# Patient Record
Sex: Male | Born: 1940 | Race: White | Hispanic: No | Smoking: Never smoker
Health system: Southern US, Community
[De-identification: ages and names within clinical notes are randomized; demographics above are authoritative.]

## PROBLEM LIST (undated history)

## (undated) DIAGNOSIS — R0600 Dyspnea, unspecified: Secondary | ICD-10-CM

## (undated) DIAGNOSIS — J189 Pneumonia, unspecified organism: Secondary | ICD-10-CM

## (undated) DIAGNOSIS — Z8719 Personal history of other diseases of the digestive system: Secondary | ICD-10-CM

## (undated) DIAGNOSIS — I10 Essential (primary) hypertension: Secondary | ICD-10-CM

## (undated) DIAGNOSIS — R4702 Dysphasia: Secondary | ICD-10-CM

## (undated) DIAGNOSIS — K219 Gastro-esophageal reflux disease without esophagitis: Secondary | ICD-10-CM

## (undated) DIAGNOSIS — I639 Cerebral infarction, unspecified: Secondary | ICD-10-CM

## (undated) HISTORY — PX: COLONOSCOPY: SHX174

## (undated) HISTORY — PX: VASECTOMY: SHX75

---

## 2021-06-01 DIAGNOSIS — N486 Induration penis plastica: Secondary | ICD-10-CM | POA: Diagnosis not present

## 2021-06-01 DIAGNOSIS — K219 Gastro-esophageal reflux disease without esophagitis: Secondary | ICD-10-CM | POA: Diagnosis not present

## 2021-06-01 DIAGNOSIS — I10 Essential (primary) hypertension: Secondary | ICD-10-CM | POA: Diagnosis not present

## 2021-06-01 DIAGNOSIS — Z8673 Personal history of transient ischemic attack (TIA), and cerebral infarction without residual deficits: Secondary | ICD-10-CM | POA: Diagnosis not present

## 2021-06-01 DIAGNOSIS — E663 Overweight: Secondary | ICD-10-CM | POA: Diagnosis not present

## 2021-06-01 DIAGNOSIS — E785 Hyperlipidemia, unspecified: Secondary | ICD-10-CM | POA: Diagnosis not present

## 2021-06-01 DIAGNOSIS — Z125 Encounter for screening for malignant neoplasm of prostate: Secondary | ICD-10-CM | POA: Diagnosis not present

## 2021-07-25 DIAGNOSIS — N529 Male erectile dysfunction, unspecified: Secondary | ICD-10-CM | POA: Diagnosis not present

## 2021-07-25 DIAGNOSIS — N486 Induration penis plastica: Secondary | ICD-10-CM | POA: Diagnosis not present

## 2021-09-02 ENCOUNTER — Other Ambulatory Visit: Payer: Self-pay | Admitting: Internal Medicine

## 2021-09-02 ENCOUNTER — Ambulatory Visit
Admission: RE | Admit: 2021-09-02 | Discharge: 2021-09-02 | Disposition: A | Discharge status: Home / Self Care | Payer: PPO | Source: Ambulatory Visit | Attending: Internal Medicine | Admitting: Internal Medicine

## 2021-09-02 DIAGNOSIS — R051 Acute cough: Secondary | ICD-10-CM | POA: Diagnosis not present

## 2021-09-02 DIAGNOSIS — R5383 Other fatigue: Secondary | ICD-10-CM | POA: Diagnosis not present

## 2021-09-02 DIAGNOSIS — R0602 Shortness of breath: Secondary | ICD-10-CM | POA: Diagnosis not present

## 2021-09-02 DIAGNOSIS — Z23 Encounter for immunization: Secondary | ICD-10-CM | POA: Diagnosis not present

## 2021-09-02 DIAGNOSIS — R1319 Other dysphagia: Secondary | ICD-10-CM | POA: Diagnosis not present

## 2021-09-02 DIAGNOSIS — R630 Anorexia: Secondary | ICD-10-CM | POA: Diagnosis not present

## 2021-09-02 DIAGNOSIS — R634 Abnormal weight loss: Secondary | ICD-10-CM | POA: Diagnosis not present

## 2021-09-02 DIAGNOSIS — Z0000 Encounter for general adult medical examination without abnormal findings: Secondary | ICD-10-CM | POA: Diagnosis not present

## 2021-09-16 ENCOUNTER — Other Ambulatory Visit: Payer: Self-pay | Admitting: Internal Medicine

## 2021-09-16 DIAGNOSIS — R1314 Dysphagia, pharyngoesophageal phase: Secondary | ICD-10-CM | POA: Diagnosis not present

## 2021-09-16 DIAGNOSIS — R1319 Other dysphagia: Secondary | ICD-10-CM

## 2021-09-16 DIAGNOSIS — J189 Pneumonia, unspecified organism: Secondary | ICD-10-CM | POA: Diagnosis not present

## 2021-09-16 DIAGNOSIS — K219 Gastro-esophageal reflux disease without esophagitis: Secondary | ICD-10-CM | POA: Diagnosis not present

## 2021-09-23 ENCOUNTER — Ambulatory Visit
Admission: RE | Admit: 2021-09-23 | Discharge: 2021-09-23 | Disposition: A | Discharge status: Home / Self Care | Payer: PPO | Source: Ambulatory Visit | Attending: Internal Medicine | Admitting: Internal Medicine

## 2021-09-23 DIAGNOSIS — R1319 Other dysphagia: Secondary | ICD-10-CM

## 2021-09-23 DIAGNOSIS — R1310 Dysphagia, unspecified: Secondary | ICD-10-CM | POA: Diagnosis not present

## 2021-10-14 DIAGNOSIS — Z86010 Personal history of colonic polyps: Secondary | ICD-10-CM | POA: Diagnosis not present

## 2021-10-14 DIAGNOSIS — K219 Gastro-esophageal reflux disease without esophagitis: Secondary | ICD-10-CM | POA: Diagnosis not present

## 2021-10-14 DIAGNOSIS — T17308A Unspecified foreign body in larynx causing other injury, initial encounter: Secondary | ICD-10-CM | POA: Diagnosis not present

## 2021-10-17 DIAGNOSIS — N529 Male erectile dysfunction, unspecified: Secondary | ICD-10-CM | POA: Diagnosis not present

## 2021-10-17 DIAGNOSIS — N486 Induration penis plastica: Secondary | ICD-10-CM | POA: Diagnosis not present

## 2021-10-18 ENCOUNTER — Other Ambulatory Visit: Payer: Self-pay | Admitting: Urology

## 2021-10-19 ENCOUNTER — Other Ambulatory Visit (HOSPITAL_COMMUNITY): Payer: Self-pay

## 2021-10-19 DIAGNOSIS — R1310 Dysphagia, unspecified: Secondary | ICD-10-CM

## 2021-11-02 ENCOUNTER — Ambulatory Visit (HOSPITAL_COMMUNITY)
Admission: RE | Admit: 2021-11-02 | Discharge: 2021-11-02 | Disposition: A | Discharge status: Home / Self Care | Payer: PPO | Source: Ambulatory Visit | Attending: Gastroenterology | Admitting: Gastroenterology

## 2021-11-02 DIAGNOSIS — R1310 Dysphagia, unspecified: Secondary | ICD-10-CM | POA: Diagnosis not present

## 2021-11-02 DIAGNOSIS — K219 Gastro-esophageal reflux disease without esophagitis: Secondary | ICD-10-CM | POA: Insufficient documentation

## 2021-11-02 DIAGNOSIS — T17308A Unspecified foreign body in larynx causing other injury, initial encounter: Secondary | ICD-10-CM | POA: Insufficient documentation

## 2021-11-02 DIAGNOSIS — Z713 Dietary counseling and surveillance: Secondary | ICD-10-CM | POA: Diagnosis not present

## 2021-11-02 DIAGNOSIS — I10 Essential (primary) hypertension: Secondary | ICD-10-CM | POA: Insufficient documentation

## 2021-11-02 DIAGNOSIS — Z8673 Personal history of transient ischemic attack (TIA), and cerebral infarction without residual deficits: Secondary | ICD-10-CM | POA: Insufficient documentation

## 2021-11-02 DIAGNOSIS — E785 Hyperlipidemia, unspecified: Secondary | ICD-10-CM | POA: Insufficient documentation

## 2021-11-02 DIAGNOSIS — X58XXXA Exposure to other specified factors, initial encounter: Secondary | ICD-10-CM | POA: Insufficient documentation

## 2021-11-10 ENCOUNTER — Encounter (HOSPITAL_BASED_OUTPATIENT_CLINIC_OR_DEPARTMENT_OTHER): Payer: Self-pay | Admitting: Urology

## 2021-11-11 ENCOUNTER — Encounter (HOSPITAL_BASED_OUTPATIENT_CLINIC_OR_DEPARTMENT_OTHER): Payer: Self-pay | Admitting: Urology

## 2021-11-11 ENCOUNTER — Other Ambulatory Visit: Payer: Self-pay

## 2021-11-11 NOTE — Progress Notes (Signed)
Spoke w/ via phone for pre-op interview---pt Lab needs dos----I stat and EKG           Lab results------n/a COVID test -----patient states asymptomatic no test needed Arrive at -------1000 NPO after MN NO Solid Food.  Clear liquids from MN until---1000 Med rec completed Medications to take morning of surgery -----amlodipine and pantoprazole Diabetic medication -----n/a Patient instructed no nail polish to be worn day of surgery Patient instructed to bring photo id and insurance card day of surgery Patient aware to have Driver (ride ) / caregiver  friend Joseph Crosby  for 24 hours after surgery  Patient Special Instructions -----overnight instructions explained to pt verbalized understanding. Pre-Op special Istructions -----hold aspirin per dr. Lafonda Mosses office stated.  Pt told to come in 3 hrs before surgery case.  Patient verbalized understanding of instructions that were given at this phone interview. Patient denies shortness of breath, chest pain, fever, cough at this phone interview.   Pt had a Baruim swallow and on chart in epic. 11/02/2021.  Pt able to swallow pills and food without problems.

## 2021-11-22 ENCOUNTER — Encounter (HOSPITAL_BASED_OUTPATIENT_CLINIC_OR_DEPARTMENT_OTHER): Payer: Self-pay | Admitting: Urology

## 2021-11-22 ENCOUNTER — Other Ambulatory Visit: Payer: Self-pay

## 2021-11-22 ENCOUNTER — Ambulatory Visit (HOSPITAL_BASED_OUTPATIENT_CLINIC_OR_DEPARTMENT_OTHER): Payer: PPO | Admitting: Certified Registered Nurse Anesthetist

## 2021-11-22 ENCOUNTER — Encounter (HOSPITAL_BASED_OUTPATIENT_CLINIC_OR_DEPARTMENT_OTHER)
Admission: RE | Disposition: A | Discharge status: Home / Self Care | Payer: Self-pay | Source: Ambulatory Visit | Attending: Urology

## 2021-11-22 ENCOUNTER — Observation Stay (HOSPITAL_BASED_OUTPATIENT_CLINIC_OR_DEPARTMENT_OTHER)
Admission: RE | Admit: 2021-11-22 | Discharge: 2021-11-23 | Disposition: A | Discharge status: Home / Self Care | Payer: PPO | Source: Ambulatory Visit | Attending: Urology | Admitting: Urology

## 2021-11-22 DIAGNOSIS — Z79899 Other long term (current) drug therapy: Secondary | ICD-10-CM | POA: Insufficient documentation

## 2021-11-22 DIAGNOSIS — Z8673 Personal history of transient ischemic attack (TIA), and cerebral infarction without residual deficits: Secondary | ICD-10-CM | POA: Insufficient documentation

## 2021-11-22 DIAGNOSIS — Z960 Presence of urogenital implants: Secondary | ICD-10-CM

## 2021-11-22 DIAGNOSIS — N528 Other male erectile dysfunction: Secondary | ICD-10-CM | POA: Diagnosis not present

## 2021-11-22 DIAGNOSIS — N486 Induration penis plastica: Secondary | ICD-10-CM | POA: Insufficient documentation

## 2021-11-22 DIAGNOSIS — I10 Essential (primary) hypertension: Secondary | ICD-10-CM | POA: Insufficient documentation

## 2021-11-22 DIAGNOSIS — Z7982 Long term (current) use of aspirin: Secondary | ICD-10-CM | POA: Diagnosis not present

## 2021-11-22 DIAGNOSIS — N529 Male erectile dysfunction, unspecified: Secondary | ICD-10-CM

## 2021-11-22 HISTORY — PX: NESBIT PROCEDURE: SHX2087

## 2021-11-22 HISTORY — DX: Essential (primary) hypertension: I10

## 2021-11-22 HISTORY — DX: Dysphasia: R47.02

## 2021-11-22 HISTORY — DX: Personal history of other diseases of the digestive system: Z87.19

## 2021-11-22 HISTORY — DX: Gastro-esophageal reflux disease without esophagitis: K21.9

## 2021-11-22 HISTORY — DX: Pneumonia, unspecified organism: J18.9

## 2021-11-22 HISTORY — PX: PENILE PROSTHESIS IMPLANT: SHX240

## 2021-11-22 HISTORY — DX: Dyspnea, unspecified: R06.00

## 2021-11-22 HISTORY — DX: Cerebral infarction, unspecified: I63.9

## 2021-11-22 LAB — POCT I-STAT, CHEM 8
BUN: 12 mg/dL (ref 8–23)
Calcium, Ion: 1.19 mmol/L (ref 1.15–1.40)
Chloride: 102 mmol/L (ref 98–111)
Creatinine, Ser: 0.8 mg/dL (ref 0.61–1.24)
Glucose, Bld: 114 mg/dL — ABNORMAL HIGH (ref 70–99)
HCT: 44 % (ref 39.0–52.0)
Hemoglobin: 15 g/dL (ref 13.0–17.0)
Potassium: 3.8 mmol/L (ref 3.5–5.1)
Sodium: 139 mmol/L (ref 135–145)
TCO2: 24 mmol/L (ref 22–32)

## 2021-11-22 SURGERY — INSERTION, PENILE PROSTHESIS
Anesthesia: General | Site: Penis

## 2021-11-22 MED ORDER — GLYCOPYRROLATE PF 0.2 MG/ML IJ SOSY
PREFILLED_SYRINGE | INTRAMUSCULAR | Status: AC
  Filled 2021-11-22: qty 1

## 2021-11-22 MED ORDER — OXYCODONE HCL 5 MG PO TABS
ORAL_TABLET | ORAL | Status: AC
  Filled 2021-11-22: qty 1

## 2021-11-22 MED ORDER — PROPOFOL 10 MG/ML IV BOLUS
INTRAVENOUS | Status: AC
  Filled 2021-11-22: qty 20

## 2021-11-22 MED ORDER — FENTANYL CITRATE (PF) 100 MCG/2ML IJ SOLN: 25.0000 ug | INTRAMUSCULAR | Status: DC | PRN

## 2021-11-22 MED ORDER — SODIUM CHLORIDE 0.9 % IV SOLN: INTRAVENOUS | Status: DC

## 2021-11-22 MED ORDER — VANCOMYCIN HCL IN DEXTROSE 1-5 GM/200ML-% IV SOLN
INTRAVENOUS | Status: AC
  Filled 2021-11-22: qty 200

## 2021-11-22 MED ORDER — SODIUM CHLORIDE 0.9 % IR SOLN
Status: DC | PRN
  Administered 2021-11-22: 1000 mL

## 2021-11-22 MED ORDER — LIDOCAINE 2% (20 MG/ML) 5 ML SYRINGE
INTRAMUSCULAR | Status: DC | PRN
  Administered 2021-11-22: 60 mg via INTRAVENOUS

## 2021-11-22 MED ORDER — DEXAMETHASONE SODIUM PHOSPHATE 10 MG/ML IJ SOLN
INTRAMUSCULAR | Status: DC | PRN
  Administered 2021-11-22: 10 mg via INTRAVENOUS

## 2021-11-22 MED ORDER — IRRISEPT - 450ML BOTTLE WITH 0.05% CHG IN STERILE WATER, USP 99.95% OPTIME
TOPICAL | Status: DC | PRN
  Administered 2021-11-22: 450 mL

## 2021-11-22 MED ORDER — VANCOMYCIN HCL 1500 MG/300ML IV SOLN
1500.0000 mg | INTRAVENOUS | Status: AC
  Administered 2021-11-22: 1500 mg via INTRAVENOUS
  Filled 2021-11-22: qty 300

## 2021-11-22 MED ORDER — PHENYLEPHRINE HCL (PRESSORS) 10 MG/ML IV SOLN
INTRAVENOUS | Status: AC
  Filled 2021-11-22: qty 1

## 2021-11-22 MED ORDER — LIDOCAINE HCL (PF) 1 % IJ SOLN
INTRAMUSCULAR | Status: DC | PRN
  Administered 2021-11-22: 10 mL

## 2021-11-22 MED ORDER — LACTATED RINGERS IV SOLN
INTRAVENOUS | Status: DC
  Administered 2021-11-22: 1000 mL via INTRAVENOUS

## 2021-11-22 MED ORDER — ACETAMINOPHEN 325 MG PO TABS
ORAL_TABLET | ORAL | Status: AC
  Filled 2021-11-22: qty 2

## 2021-11-22 MED ORDER — KETOROLAC TROMETHAMINE 30 MG/ML IJ SOLN
INTRAMUSCULAR | Status: AC
  Filled 2021-11-22: qty 3

## 2021-11-22 MED ORDER — OXYCODONE HCL 5 MG/5ML PO SOLN: 5.0000 mg | Freq: Once | ORAL | Status: DC | PRN

## 2021-11-22 MED ORDER — ONDANSETRON HCL 4 MG/2ML IJ SOLN: 4.0000 mg | Freq: Once | INTRAMUSCULAR | Status: DC | PRN

## 2021-11-22 MED ORDER — FENTANYL CITRATE (PF) 100 MCG/2ML IJ SOLN
INTRAMUSCULAR | Status: DC | PRN
  Administered 2021-11-22 (×4): 25 ug via INTRAVENOUS

## 2021-11-22 MED ORDER — ACETAMINOPHEN 500 MG PO TABS
1000.0000 mg | ORAL_TABLET | Freq: Once | ORAL | Status: AC
Start: 2021-11-22 — End: 2021-11-22
  Administered 2021-11-22: 1000 mg via ORAL

## 2021-11-22 MED ORDER — VANCOMYCIN HCL IN DEXTROSE 1-5 GM/200ML-% IV SOLN
1000.0000 mg | Freq: Three times a day (TID) | INTRAVENOUS | Status: AC
  Administered 2021-11-22: 1000 mg via INTRAVENOUS

## 2021-11-22 MED ORDER — CHLORHEXIDINE GLUCONATE 4 % EX LIQD
Freq: Once | CUTANEOUS | Status: DC
Start: 2021-11-22 — End: 2021-11-22

## 2021-11-22 MED ORDER — ROCURONIUM BROMIDE 10 MG/ML (PF) SYRINGE
PREFILLED_SYRINGE | INTRAVENOUS | Status: DC | PRN
  Administered 2021-11-22 (×2): 30 mg via INTRAVENOUS

## 2021-11-22 MED ORDER — ACETAMINOPHEN 325 MG PO TABS
650.0000 mg | ORAL_TABLET | Freq: Four times a day (QID) | ORAL | Status: DC
Start: 2021-11-22 — End: 2021-11-25
  Administered 2021-11-22 – 2021-11-23 (×3): 650 mg via ORAL

## 2021-11-22 MED ORDER — PHENYLEPHRINE HCL-NACL 20-0.9 MG/250ML-% IV SOLN
INTRAVENOUS | Status: DC | PRN
  Administered 2021-11-22: 20 ug/min via INTRAVENOUS

## 2021-11-22 MED ORDER — ONDANSETRON HCL 4 MG/2ML IJ SOLN
INTRAMUSCULAR | Status: AC
  Filled 2021-11-22: qty 2

## 2021-11-22 MED ORDER — OXYCODONE HCL 5 MG PO TABS: 5.0000 mg | ORAL_TABLET | Freq: Once | ORAL | Status: DC | PRN

## 2021-11-22 MED ORDER — CELECOXIB 200 MG PO CAPS
200.0000 mg | ORAL_CAPSULE | Freq: Two times a day (BID) | ORAL | Status: DC
Start: 2021-11-22 — End: 2021-11-25
  Administered 2021-11-22: 200 mg via ORAL

## 2021-11-22 MED ORDER — ACETAMINOPHEN 500 MG PO TABS
ORAL_TABLET | ORAL | Status: AC
  Filled 2021-11-22: qty 2

## 2021-11-22 MED ORDER — GENTAMICIN SULFATE 40 MG/ML IJ SOLN
150.0000 mg | INTRAVENOUS | Status: AC
  Administered 2021-11-22: 150 mg via INTRAVENOUS
  Filled 2021-11-22 (×2): qty 3.75

## 2021-11-22 MED ORDER — PROPOFOL 10 MG/ML IV BOLUS
INTRAVENOUS | Status: DC | PRN
  Administered 2021-11-22: 120 mg via INTRAVENOUS

## 2021-11-22 MED ORDER — ROCURONIUM BROMIDE 10 MG/ML (PF) SYRINGE
PREFILLED_SYRINGE | INTRAVENOUS | Status: AC
  Filled 2021-11-22: qty 10

## 2021-11-22 MED ORDER — CELECOXIB 200 MG PO CAPS
ORAL_CAPSULE | ORAL | Status: AC
  Filled 2021-11-22: qty 1

## 2021-11-22 MED ORDER — SUCCINYLCHOLINE CHLORIDE 200 MG/10ML IV SOSY
PREFILLED_SYRINGE | INTRAVENOUS | Status: DC | PRN
  Administered 2021-11-22: 120 mg via INTRAVENOUS

## 2021-11-22 MED ORDER — ONDANSETRON HCL 4 MG/2ML IJ SOLN
INTRAMUSCULAR | Status: DC | PRN
  Administered 2021-11-22: 4 mg via INTRAVENOUS

## 2021-11-22 MED ORDER — SULFAMETHOXAZOLE-TRIMETHOPRIM 800-160 MG PO TABS
1.0000 | ORAL_TABLET | Freq: Two times a day (BID) | ORAL | Status: DC
  Administered 2021-11-22: 1 via ORAL
  Filled 2021-11-22: qty 1

## 2021-11-22 MED ORDER — SUGAMMADEX SODIUM 200 MG/2ML IV SOLN
INTRAVENOUS | Status: DC | PRN
  Administered 2021-11-22: 100 mg via INTRAVENOUS

## 2021-11-22 MED ORDER — PHENYLEPHRINE 80 MCG/ML (10ML) SYRINGE FOR IV PUSH (FOR BLOOD PRESSURE SUPPORT)
PREFILLED_SYRINGE | INTRAVENOUS | Status: AC
  Filled 2021-11-22: qty 20

## 2021-11-22 MED ORDER — GLYCOPYRROLATE PF 0.2 MG/ML IJ SOSY
PREFILLED_SYRINGE | INTRAMUSCULAR | Status: DC | PRN
  Administered 2021-11-22: .2 mg via INTRAVENOUS

## 2021-11-22 MED ORDER — PHENYLEPHRINE 80 MCG/ML (10ML) SYRINGE FOR IV PUSH (FOR BLOOD PRESSURE SUPPORT)
PREFILLED_SYRINGE | INTRAVENOUS | Status: DC | PRN
  Administered 2021-11-22: 80 ug via INTRAVENOUS

## 2021-11-22 MED ORDER — FENTANYL CITRATE (PF) 100 MCG/2ML IJ SOLN
INTRAMUSCULAR | Status: AC
  Filled 2021-11-22: qty 2

## 2021-11-22 MED ORDER — DEXAMETHASONE SODIUM PHOSPHATE 10 MG/ML IJ SOLN
INTRAMUSCULAR | Status: AC
  Filled 2021-11-22: qty 1

## 2021-11-22 MED ORDER — OXYCODONE HCL 5 MG PO TABS
5.0000 mg | ORAL_TABLET | Freq: Four times a day (QID) | ORAL | Status: DC | PRN
  Administered 2021-11-22 – 2021-11-23 (×3): 5 mg via ORAL

## 2021-11-22 SURGICAL SUPPLY — 57 items
ADH SKN CLS APL DERMABOND .7 (GAUZE/BANDAGES/DRESSINGS) ×1
APL PRP STRL LF DISP 70% ISPRP (MISCELLANEOUS) ×1
BAG DRN RND TRDRP ANRFLXCHMBR (UROLOGICAL SUPPLIES) ×1
BAG URINE DRAIN 2000ML AR STRL (UROLOGICAL SUPPLIES) ×1 IMPLANT
BIOPATCH BLUE 3/4IN DISK W/1.5 (GAUZE/BANDAGES/DRESSINGS) ×1 IMPLANT
BLADE SURG 15 STRL LF DISP TIS (BLADE) ×1 IMPLANT
BLADE SURG 15 STRL SS (BLADE) ×1
BNDG GAUZE DERMACEA FLUFF (GAUZE/BANDAGES/DRESSINGS) ×1
BNDG GAUZE DERMACEA FLUFF 4 (GAUZE/BANDAGES/DRESSINGS) ×1 IMPLANT
BNDG GZE DERMACEA 4 6PLY (GAUZE/BANDAGES/DRESSINGS) ×1
CATH COUDE 5CC RIBBED (CATHETERS) ×1 IMPLANT
CATH RIBBED COUDE 5CC (CATHETERS) ×1
CHLORAPREP W/TINT 26 (MISCELLANEOUS) ×1 IMPLANT
COVER BACK TABLE 60X90IN (DRAPES) ×1 IMPLANT
COVER MAYO STAND STRL (DRAPES) ×2 IMPLANT
DERMABOND ADVANCED (GAUZE/BANDAGES/DRESSINGS) ×1
DERMABOND ADVANCED .7 DNX12 (GAUZE/BANDAGES/DRESSINGS) ×1 IMPLANT
DRAIN CHANNEL 10F 3/8 F FF (DRAIN) ×1 IMPLANT
DRAPE INCISE IOBAN 66X45 STRL (DRAPES) ×1 IMPLANT
DRAPE LAPAROTOMY 100X72 PEDS (DRAPES) ×1 IMPLANT
EVACUATOR DRAINAGE 7X20 100CC (MISCELLANEOUS) IMPLANT
EVACUATOR SILICONE 100CC (MISCELLANEOUS)
GAUZE 4X4 16PLY ~~LOC~~+RFID DBL (SPONGE) ×1 IMPLANT
GAUZE SPONGE 4X4 12PLY STRL (GAUZE/BANDAGES/DRESSINGS) ×1 IMPLANT
GLOVE BIO SURGEON STRL SZ7 (GLOVE) ×1 IMPLANT
GLOVE BIO SURGEON STRL SZ7.5 (GLOVE) ×3 IMPLANT
GOWN STRL REUS W/TWL LRG LVL3 (GOWN DISPOSABLE) ×1 IMPLANT
HOLDER FOLEY CATH W/STRAP (MISCELLANEOUS) ×1 IMPLANT
JET LAVAGE IRRISEPT WOUND (IRRIGATION / IRRIGATOR) ×2
KIT ACCESSORY AMS 700 PUMP (UROLOGICAL SUPPLIES) IMPLANT
KIT TURNOVER CYSTO (KITS) ×1 IMPLANT
LAVAGE JET IRRISEPT WOUND (IRRIGATION / IRRIGATOR) IMPLANT
NEEDLE HYPO 22GX1.5 SAFETY (NEEDLE) IMPLANT
NS IRRIG 1000ML POUR BTL (IV SOLUTION) ×1 IMPLANT
PACK BASIN DAY SURGERY FS (CUSTOM PROCEDURE TRAY) ×1 IMPLANT
PANTS MESH DISP LRG (UNDERPADS AND DIAPERS) ×1 IMPLANT
PENCIL SMOKE EVACUATOR (MISCELLANEOUS) ×1 IMPLANT
PLUG CATH AND CAP STER (CATHETERS) ×1 IMPLANT
PUMP PENILE AMS 700CX MS 12X21 (Erectile Restoration) IMPLANT
RESERVOIR FLAT IZ 100ML (Miscellaneous) IMPLANT
RETRACTOR DEEP SCROTAL PENILE (MISCELLANEOUS) IMPLANT
RETRACTOR WILSON SYSTEM (INSTRUMENTS) IMPLANT
SET COLLECT BLD 21X.75 12 PB G (NEEDLE) IMPLANT
SET COLLECT BLD 25X3/4 12 (NEEDLE) IMPLANT
SUT MON AB 3-0 SH 27 (SUTURE) ×4
SUT MON AB 3-0 SH27 (SUTURE) ×4 IMPLANT
SUT VIC AB 2-0 UR6 27 (SUTURE) ×2 IMPLANT
SUT VIC AB 4-0 PS2 18 (SUTURE) IMPLANT
SYR 10ML LL (SYRINGE) ×3 IMPLANT
SYR 50ML LL SCALE MARK (SYRINGE) ×2 IMPLANT
SYR BULB IRRIG 60ML STRL (SYRINGE) ×1 IMPLANT
SYR CONTROL 10ML LL (SYRINGE) IMPLANT
TOWEL OR 17X26 10 PK STRL BLUE (TOWEL DISPOSABLE) ×2 IMPLANT
TRAY DSU PREP LF (CUSTOM PROCEDURE TRAY) ×1 IMPLANT
TUBE CONNECTING 12X1/4 (SUCTIONS) ×1 IMPLANT
WATER STERILE IRR 1000ML POUR (IV SOLUTION) ×1 IMPLANT
YANKAUER SUCT BULB TIP NO VENT (SUCTIONS) ×2 IMPLANT

## 2021-11-22 NOTE — Op Note (Signed)
PATIENT:  Joseph Crosby  PRE-OPERATIVE DIAGNOSIS:   Organic erectile dysfunction Peyronie's Disease  POST-OPERATIVE DIAGNOSIS:  Same  PROCEDURE:  Procedure(s):  3 piece inflatable penile prosthesis (BS/AMS) Penile Plication  SURGEON:  [name] MD  INDICATION: He has had long-standing organic erectile dysfunction and refractory to other modes of treatment. Mr Anselmi also has clinically significant Peyronie's Disease. He has elected to proceed with prosthesis implantation.  ANESTHESIA:  General  EBL:  Minimal  Device: 3 piece AMS CX 700: 100 cc reservoir, 21 cm cylinders and 0 cm rear-tip extenders on right and left sides  LOCAL MEDICATIONS USED:  None  SPECIMEN: None  DISPOSITION OF SPECIMEN:  N/A  Description of procedure: The patient was taken to the major operating room, placed on the table and administered general anesthesia in the supine position. His genitalia was then prepped with chlorhexidine x 2. He was draped in the usual sterile fashion, and I used [place] on the field. An official timeout was then performed.  The patient was given an artificial erection with a butterfly needle, NS, and 10 cc of lidocaine/marcaine. There was ~55 degrees of dorsal curvature, and <20 degrees leftward curvature. A dorsal penile block was done with the lidocaine/marcaine mixture as well.  A 14 French coude catheter was then placed in the bladder and the bladder was drained and the catheter was plugged. A midline high penoscrotal incision was then made and the dissection was carried down to the corpora and urethra. The dissection was extended distally, past the point of maximum curvature on the penis. A plicating suture was done on each corpora using a 2-0 Ethibond. Another artificial erection was given, and the curvature had improved to around 40-45 degrees.   The lonestar retractor was positioned so as to have excellent exposure. 2-0 Vicryl sutures were then placed proximally in each corpus  cavernosum to serve as stay sutures. An incision was then made in the corpus cavernosum first on the left-hand side with the bovie. Jen Mow were used to gently dilate the opening. I then dilated the corpus cavernosum with the a 12 Fr brooks dilator distally and proximally. Field goal post tests were performed and there was no evidence of perforations or crossover. I then irrigated the corpus cavernosum with antibiotic solution and measured the distance proximally and distally from the stay suture and was found to be 10.5 and 10 cm, respectively.I then turned my attention to the contralateral corpus cavernosum and placed my stay sutures, made my corporotomy and dilated the corpus cavernosum in an identical fashion. This was measured and also was found to be 10 cm proximally and 11 distally. It was irrigated with anastomotic solution as was the scrotum. I then chose an 21 cm cylinder set with 0 cm rear-tip extenders and these were prepped while I prepared the site for reservoir placement.  I then digitally probed into the R external inguinal ring. A kelly was used to poke through the posterior wall of the ring. I used my finger to ensure I was in the appropriate space, and to clear room for the reservoir. I irrigated the space with anastomotic solution and then placed the reservoir in this location. I then filled the reservoir with 100 cc of sterile saline, and checked to confirm proper position. There was minimal backpressure with the reservoir max-filled.  Attention was redirected to the corporotomies where the cylinders were then placed by first fixing the suture to the distal aspect of the right cylinder to a straight needle.  This was then loaded on the [facility] inserter and passed through the corporotomy and distally. I then advanced the straight needle with the Furlow inserter out through the glans and this was grasped with a hemostat and pulled through the glans and the suture was secured with a hemostat. I then  performed an identical maneuver on the contralateral side. After this was performed I irrigated both corpus cavernosum; there was no evidence of urethral perforation. I inserted the distal portion of the cylinder through the corporotomies and pulled this to the end of the corpora with the suture. The proximal aspect with the rear-tip extender was then passed through the corporotomy and into the seated position on each side. I then connected reservoir tubing to a syringe filled with sterile saline and inflated the device. I noted a good erection with both cylinders equidistant under the glans and no buckling of the cylinders. At this point the curvature was <30 degrees, so we elected to not perform modeling.   I therefore deflated the device and closed the corporotomies with used my previously placed stay sutures. The cylinder was then connected to the pump after excising the excess tubing with appropriate shodded hemostats in place and then I used the supplied connectors to make the connection. I then again cycled the device with the pump and it cycled properly. I deflated the device and pumped it up about half way to aid with hemostasis. I irrigated the scrotum once again with antibiotic solution.  I then grasped the scrotal skin in the midline with a babcock, and used a hemostat to dissect down to the dependent-most portion of the scrotum. The nasal speculum was inserted into this space, and facilitated placement of the pump. I irrigated the wound one last time with antibiotic irrigation and then closed the deep scrotal tissue over the tubing and pump with running 3-0 monocryl suture. I placed a 19 Fr blake drain over the corporotomies. A second layer was then closed over this first layer with running 3- 0 monocryl, and running skin suture w/ 4-0 monocryl performed. Incision dressed with dermabond.  A mummy wrap was applied. The catheter was connected to closed system drainage, and drain connected to suction  bulb and the patient was awakened and taken recovery room in stable and satisfactory condition. He tolerated the procedure well and there were no intraoperative complications. Needle sponge and instrument counts were correct at the end of the operation.

## 2021-11-22 NOTE — Anesthesia Procedure Notes (Signed)
Procedure Name: Intubation Date/Time: 11/22/2021 1:04 PM  Performed by: Rogers Blocker, CRNAPre-anesthesia Checklist: Patient identified, Emergency Drugs available, Suction available and Patient being monitored Patient Re-evaluated:Patient Re-evaluated prior to induction Oxygen Delivery Method: Circle System Utilized Preoxygenation: Pre-oxygenation with 100% oxygen Induction Type: IV induction Ventilation: Mask ventilation without difficulty Laryngoscope Size: Mac and 4 Grade View: Grade I Tube type: Oral Tube size: 7.5 mm Number of attempts: 1 Airway Equipment and Method: Stylet and Bite block Placement Confirmation: ETT inserted through vocal cords under direct vision, positive ETCO2 and breath sounds checked- equal and bilateral Secured at: 22 cm Tube secured with: Tape Dental Injury: Teeth and Oropharynx as per pre-operative assessment

## 2021-11-22 NOTE — Anesthesia Preprocedure Evaluation (Addendum)
Anesthesia Evaluation  Patient identified by MRN, date of birth, ID band Patient awake    Reviewed: Allergy & Precautions, NPO status , Patient's Chart, lab work & pertinent test results  Airway Mallampati: III  TM Distance: >3 FB Neck ROM: Full    Dental  (+) Teeth Intact, Dental Advisory Given   Pulmonary pneumonia (09/2021), resolved,    Pulmonary exam normal breath sounds clear to auscultation       Cardiovascular hypertension (139/72 in preop), Pt. on medications Normal cardiovascular exam Rhythm:Regular Rate:Normal     Neuro/Psych TIAnegative psych ROS   GI/Hepatic Neg liver ROS, hiatal hernia, GERD  Medicated and Controlled,Has been experiencing some early morning dysphagia/coughing up phlegm, but able to swallow pills/eat solids without issue. normal barium swallow study 11/2021  Denies reflex currently    Endo/Other  negative endocrine ROS  Renal/GU negative Renal ROS  negative genitourinary   Musculoskeletal negative musculoskeletal ROS (+)   Abdominal   Peds  Hematology negative hematology ROS (+)   Anesthesia Other Findings   Reproductive/Obstetrics ED, peyronies                             Anesthesia Physical Anesthesia Plan  ASA: 2  Anesthesia Plan: General   Post-op Pain Management: Tylenol PO (pre-op)*   Induction: Intravenous and Rapid sequence  PONV Risk Score and Plan: 2 and Ondansetron, Dexamethasone, Midazolam and Treatment may vary due to age or medical condition  Airway Management Planned: Oral ETT  Additional Equipment: None  Intra-op Plan:   Post-operative Plan: Extubation in OR  Informed Consent: I have reviewed the patients History and Physical, chart, labs and discussed the procedure including the risks, benefits and alternatives for the proposed anesthesia with the patient or authorized representative who has indicated his/her understanding and  acceptance.     Dental advisory given  Plan Discussed with: CRNA  Anesthesia Plan Comments:        Anesthesia Quick Evaluation

## 2021-11-22 NOTE — Transfer of Care (Signed)
Immediate Anesthesia Transfer of Care Note  Patient: Joseph Crosby  Procedure(s) Performed: IMPLANTATION OF INFLATABLE PENILE PROSTHESIS (Penis) NESBIT PROCEDURE/PENILE PLICATION/POSSIBLE INCISION AND GRAFTING (Penis)  Patient Location: PACU  Anesthesia Type:General  Level of Consciousness: awake, alert , oriented and patient cooperative  Airway & Oxygen Therapy: Patient Spontanous Breathing with face mask O2  Post-op Assessment: Report given to RN and Post -op Vital signs reviewed and stable  Post vital signs: Reviewed and stable  Last Vitals:  Vitals Value Taken Time  BP 145/83 11/22/21 1526  Temp    Pulse 67 11/22/21 1528  Resp 15 11/22/21 1528  SpO2 97 % 11/22/21 1528  Vitals shown include unvalidated device data.  Last Pain:  Vitals:   11/22/21 1010  TempSrc: Oral  PainSc: 0-No pain      Patients Stated Pain Goal: 7 (11/22/21 1010)  Complications: No notable events documented.

## 2021-11-22 NOTE — Discharge Instructions (Signed)
Penile prosthesis postoperative instructions  Wound:  In most cases your incision will have absorbable sutures that will dissolve within the first 10-20 days. Some will fall out even earlier. Expect some redness as the sutures dissolved but this should occur only around the sutures. If there is generalized redness, especially with increasing pain or swelling, let us know. The scrotum and penis will very likely get "black and blue" as the blood in the tissues spread. Sometimes the whole scrotum will turn colors. The black and blue is followed by a yellow and brown color. In time, all the discoloration will go away. In some cases some firm swelling in the area of the testicle and pump may persist for up to 4-6 weeks after the surgery and is considered normal in most cases.  Drain:   You may be discharged home with a drain in place. If so, you will be taught how to empty it and should keep track of the output. Additionally, you should call the office to arrange for an appointment to have it removed after a few days.   Diet:  You may return to your normal diet within 24 hours following your surgery. You may note some mild nausea and possibly vomiting the first 6-8 hours following surgery. This is usually due to the side effects of anesthesia, and will disappear quite soon. I would suggest clear liquids and a very light meal the first evening following your surgery.  Activity:  Your physical activity should be restricted the first 48 hours. During that time you should remain relatively inactive, moving about only when necessary. During the first 3 weeks following surgery you should avoid lifting any heavy objects (anything greater than 15 pounds), and avoid strenuous exercise. If you work, ask us specifically about your restrictions, both for work and home. We will write a note to your employer if needed.  Avoid using your penis until your follow up visit with Dr Jasmyn Picha, which will typically be around  3-4 weeks following the surgery. Most people are able to start cycling their device after that appointment, and can have intercourse soon thereafter.   You should plan to wear a tight pair of jockey shorts or an athletic supporter for the first 4-5 days, even to sleep. This will keep the scrotum immobilized to some degree and keep the swelling down.The position of your penis will determine what is most comfortable but I strongly urge you to keep the penis in the "up" position (toward your head). You should continue to tuck "up" your penis when possible for the first 3 months following surgery.  Ice packs should be placed on and off over the scrotum for the first 48 hours. Frozen peas or corn in a ZipLock bag can be frozen, used and re-frozen. Fifteen minutes on and 15 minutes off is a reasonable schedule. The ice is a good pain reliever and keeps the swelling down.  Hygiene:  You may shower 48 hours after your surgery. Tub bathing should be restricted until the wound is completely healed, typically around 2-3 weeks.  Medication:  You will be sent home with some type of pain medication. In many cases you will be sent home with a strong anti-inflammatory medication (Celebrex, Meloxicam) and a narcotic pain pill (hydrocodone or oxycodone). You can also supplement these medications with tylenol (acetaminophen). If the pain medication you are sent home with does not control the pain, please notify the office Problems you should report to us:  Fever of 101.0 degrees   Fahrenheit or greater. Moderate or severe swelling under the skin incision or involving the scrotum. Drug reaction such as hives, a rash, nausea or vomiting.  

## 2021-11-22 NOTE — H&P (Signed)
H&P   History of Present Illness: Joseph Crosby is a 81 y.o. year old with a history of refractory ED and Peyronie's disease. He presents today for implantation of penile prosthesis and indicated procedures for Peyronies.  Past Medical History:  Diagnosis Date   Dysphasia    barrium on 11/02/2021  able to swallow pills and eat with no problems   Dyspnea    when he had pneumonia in june 2023   GERD (gastroesophageal reflux disease)    History of hiatal hernia    Hypertension    Pneumonia    had pneumonia in june 2023   Stroke [facility])    called them TIA hasn't had any in 10 yrears. 11/11/2021    Past Surgical History:  Procedure Laterality Date   COLONOSCOPY N/A    has had a few. 11/11/2021   VASECTOMY     had done 20+ yrs ago. 11/11/2021    Home Medications:  Current Meds  Medication Sig   amLODipine (NORVASC) 5 MG tablet Take 5 mg by mouth daily.   aspirin EC 81 MG tablet Take 81 mg by mouth daily. Swallow whole.   atorvastatin (LIPITOR) 40 MG tablet Take 40 mg by mouth daily. In evening time   hydrochlorothiazide (MICROZIDE) 12.5 MG capsule Take 12.5 mg by mouth daily.   losartan (COZAAR) 50 MG tablet Take 50 mg by mouth daily.   Multiple Vitamin (MULTIVITAMIN) tablet Take 1 tablet by mouth daily.   oxymetazoline (AFRIN) 0.05 % nasal spray Place 1 spray into both nostrils 2 (two) times daily.   pantoprazole (PROTONIX) 40 MG tablet Take 40 mg by mouth daily.    Allergies: Not on File  History reviewed. No pertinent family history.  Social History:  reports that he has never smoked. He has never used smokeless tobacco. He reports that he does not currently use alcohol. He reports that he does not use drugs.  ROS: A complete review of systems was performed.  All systems are negative except for pertinent findings as noted.  Physical Exam:  Vital signs in last 24 hours: Temp:  [98.3 F (36.8 C)] 98.3 F (36.8 C) (08/22 1010) Pulse Rate:  [65] 65 (08/22 1010) Resp:   [16] 16 (08/22 1010) BP: (139)/(72) 139/72 (08/22 1010) SpO2:  [99 %] 99 % (08/22 1010) Weight:  [84 kg] 84 kg (08/22 1010) Constitutional:  Alert and oriented, No acute distress Cardiovascular: Regular rate and rhythm, No JVD Respiratory: Normal respiratory effort, Lungs clear bilaterally GI: Abdomen is soft, nontender, nondistended, no abdominal masses GU: No CVA tenderness Lymphatic: No lymphadenopathy Neurologic: Grossly intact, no focal deficits Psychiatric: Normal mood and affect Drains/Tubes: none   Laboratory Data:  Recent Labs    11/22/21 0957  HGB 15.0  HCT 44.0    Recent Labs    11/22/21 0957  NA 139  K 3.8  CL 102  GLUCOSE 114*  BUN 12  CREATININE 0.80     Results for orders placed or performed during the hospital encounter of 11/22/21 (from the past 24 hour(s))  I-STAT, chem 8     Status: Abnormal   Collection Time: 11/22/21  9:57 AM  Result Value Ref Range   Sodium 139 135 - 145 mmol/L   Potassium 3.8 3.5 - 5.1 mmol/L   Chloride 102 98 - 111 mmol/L   BUN 12 8 - 23 mg/dL   Creatinine, Ser 5.36 0.61 - 1.24 mg/dL   Glucose, Bld 144 (H) 70 - 99 mg/dL  Calcium, Ion 1.19 1.15 - 1.40 mmol/L   TCO2 24 22 - 32 mmol/L   Hemoglobin 15.0 13.0 - 17.0 g/dL   HCT 49.7 53.0 - 05.1 %   No results found for this or any previous visit (from the past 240 hour(s)).  Renal Function: Recent Labs    11/22/21 0957  CREATININE 0.80   Estimated Creatinine Clearance: 80.3 mL/min (by C-G formula based on SCr of 0.8 mg/dL).  Radiologic Imaging: No results found.  Assessment:  81 yo M with ED and Peyronie's disease   Plan:  To OR as planned. We reviewed risks (including but not limited to bleeding, infection, device infection, damage to associated structures requiring aborting the procedure, persistent penile curvature, device malfunction)  Joseph Seal, MD 11/22/2021, 11:17 AM  [facility] Pager: [phone]

## 2021-11-23 DIAGNOSIS — N528 Other male erectile dysfunction: Secondary | ICD-10-CM | POA: Diagnosis not present

## 2021-11-23 MED ORDER — ACETAMINOPHEN 325 MG PO TABS: 650.0000 mg | ORAL_TABLET | Freq: Four times a day (QID) | ORAL | 1 refills | Status: DC | PRN

## 2021-11-23 MED ORDER — OXYCODONE HCL 5 MG PO TABS: 5.0000 mg | ORAL_TABLET | Freq: Four times a day (QID) | ORAL | 0 refills | Status: DC | PRN

## 2021-11-23 MED ORDER — OXYCODONE HCL 5 MG PO TABS
ORAL_TABLET | ORAL | Status: AC
  Filled 2021-11-23: qty 1

## 2021-11-23 MED ORDER — ONDANSETRON HCL 4 MG/2ML IJ SOLN
4.0000 mg | INTRAMUSCULAR | Status: DC | PRN
Start: 2021-11-23 — End: 2021-11-25
  Administered 2021-11-23: 4 mg via INTRAVENOUS

## 2021-11-23 MED ORDER — ACETAMINOPHEN 325 MG PO TABS
ORAL_TABLET | ORAL | Status: AC
  Filled 2021-11-23: qty 2

## 2021-11-23 MED ORDER — SULFAMETHOXAZOLE-TRIMETHOPRIM 800-160 MG PO TABS: 1.0000 | ORAL_TABLET | Freq: Two times a day (BID) | ORAL | 0 refills | Status: AC

## 2021-11-23 MED ORDER — ONDANSETRON HCL 4 MG/2ML IJ SOLN
INTRAMUSCULAR | Status: AC
  Filled 2021-11-23: qty 2

## 2021-11-23 MED ORDER — ONDANSETRON 8 MG PO TBDP: 8.0000 mg | ORAL_TABLET | Freq: Three times a day (TID) | ORAL | 0 refills | Status: DC | PRN

## 2021-11-23 MED ORDER — CELECOXIB 200 MG PO CAPS: 200.0000 mg | ORAL_CAPSULE | Freq: Two times a day (BID) | ORAL | 0 refills | Status: DC

## 2021-11-23 NOTE — Anesthesia Postprocedure Evaluation (Signed)
Anesthesia Post Note  Patient: Joseph Crosby  Procedure(s) Performed: IMPLANTATION OF INFLATABLE PENILE PROSTHESIS (Penis) NESBIT PROCEDURE/PENILE PLICATION/POSSIBLE INCISION AND GRAFTING (Penis)     Patient location during evaluation: PACU Anesthesia Type: General Level of consciousness: awake and alert, oriented and patient cooperative Pain management: pain level controlled Vital Signs Assessment: post-procedure vital signs reviewed and stable Respiratory status: spontaneous breathing, nonlabored ventilation and respiratory function stable Cardiovascular status: blood pressure returned to baseline and stable Postop Assessment: no apparent nausea or vomiting Anesthetic complications: no   No notable events documented.  Last Vitals:  Vitals:   11/23/21 0630 11/23/21 0910  BP: 110/70 127/68  Pulse: 73 90  Resp: 14 15  Temp: 36.8 C 36.8 C  SpO2: 93% 94%    Last Pain:  Vitals:   11/23/21 0910  TempSrc:   PainSc: 0-No pain                 Lannie Fields

## 2021-11-23 NOTE — Discharge Summary (Signed)
Date of admission: 11/22/2021  Date of discharge: 11/23/2021  Admission diagnosis:  S/P insertion of penile implant [Z96.0]   Discharge diagnosis:  S/P insertion of penile implant [Z96.0]  Secondary diagnoses: peyronie's disease  Active Ambulatory Problems    Diagnosis Date Noted   No Active Ambulatory Problems   Resolved Ambulatory Problems    Diagnosis Date Noted   No Resolved Ambulatory Problems   Past Medical History:  Diagnosis Date   Dysphasia    Dyspnea    GERD (gastroesophageal reflux disease)    History of hiatal hernia    Hypertension    Pneumonia    Stroke [facility])      History and Physical: For full details, please see admission history and physical. Briefly, Joseph Crosby is a 81 y.o. year old patient who was admitted who was admitted after the aforementioned operation.   Hospital Course: POD the foley was removed. The drain was removed after output was low overnight.  EXAM: GEN: NAD, Aox3 CV: RRR RESP:CTAB GU: some bruising present, minimal swelling. Drain with scant s/s output at the time of exam Foley draining clear yellow urine  On the day of discharge, the patient was tolerating a regular diet and their pain was well controlled. They were determined to be stable for discharge home  Laboratory values:  Recent Labs    11/22/21 0957  HGB 15.0  HCT 44.0   Recent Labs    11/22/21 0957  CREATININE 0.80    Disposition: Home  Discharge medications:  Allergies as of 11/23/2021   Not on File      Medication List     TAKE these medications    acetaminophen 325 MG tablet Commonly known as: TYLENOL Take 2 tablets (650 mg total) by mouth every 6 (six) hours as needed for mild pain or moderate pain.   amLODipine 5 MG tablet Commonly known as: NORVASC Take 5 mg by mouth daily.   aspirin EC 81 MG tablet Take 81 mg by mouth daily. Swallow whole.   atorvastatin 40 MG tablet Commonly known as: LIPITOR Take 40 mg by mouth daily. In evening time    celecoxib 200 MG capsule Commonly known as: CELEBREX Take 1 capsule (200 mg total) by mouth 2 (two) times daily.   hydrochlorothiazide 12.5 MG capsule Commonly known as: MICROZIDE Take 12.5 mg by mouth daily.   losartan 50 MG tablet Commonly known as: COZAAR Take 50 mg by mouth daily.   multivitamin tablet Take 1 tablet by mouth daily.   oxyCODONE 5 MG immediate release tablet Commonly known as: Oxy IR/ROXICODONE Take 1 tablet (5 mg total) by mouth every 6 (six) hours as needed for breakthrough pain or severe pain.   oxymetazoline 0.05 % nasal spray Commonly known as: AFRIN Place 1 spray into both nostrils 2 (two) times daily.   pantoprazole 40 MG tablet Commonly known as: PROTONIX Take 40 mg by mouth daily.   sulfamethoxazole-trimethoprim 800-160 MG tablet Commonly known as: BACTRIM DS Take 1 tablet by mouth 2 (two) times daily for 10 days.               Discharge Care Instructions  (From admission, onward)           Start     Ordered   11/23/21 0000  No dressing needed        11/23/21 0835            Followup:   Follow-up Information     Despina Arias,  MD Follow up on 12/19/2021.   Specialty: Urology Contact information: [address] [place] [address] [phone]

## 2021-11-24 ENCOUNTER — Encounter (HOSPITAL_BASED_OUTPATIENT_CLINIC_OR_DEPARTMENT_OTHER): Payer: Self-pay | Admitting: Urology

## 2021-12-19 DIAGNOSIS — N529 Male erectile dysfunction, unspecified: Secondary | ICD-10-CM | POA: Diagnosis not present

## 2021-12-19 DIAGNOSIS — Z48816 Encounter for surgical aftercare following surgery on the genitourinary system: Secondary | ICD-10-CM | POA: Diagnosis not present

## 2022-03-03 DIAGNOSIS — H5203 Hypermetropia, bilateral: Secondary | ICD-10-CM | POA: Diagnosis not present

## 2022-03-03 DIAGNOSIS — H04123 Dry eye syndrome of bilateral lacrimal glands: Secondary | ICD-10-CM | POA: Diagnosis not present

## 2022-03-03 DIAGNOSIS — H2513 Age-related nuclear cataract, bilateral: Secondary | ICD-10-CM | POA: Diagnosis not present

## 2022-03-20 ENCOUNTER — Other Ambulatory Visit: Payer: Self-pay | Admitting: Internal Medicine

## 2022-03-20 DIAGNOSIS — E785 Hyperlipidemia, unspecified: Secondary | ICD-10-CM | POA: Diagnosis not present

## 2022-03-20 DIAGNOSIS — Z8673 Personal history of transient ischemic attack (TIA), and cerebral infarction without residual deficits: Secondary | ICD-10-CM | POA: Diagnosis not present

## 2022-03-20 DIAGNOSIS — R0602 Shortness of breath: Secondary | ICD-10-CM | POA: Diagnosis not present

## 2022-03-20 DIAGNOSIS — I10 Essential (primary) hypertension: Secondary | ICD-10-CM | POA: Diagnosis not present

## 2022-03-20 DIAGNOSIS — N486 Induration penis plastica: Secondary | ICD-10-CM | POA: Diagnosis not present

## 2022-03-20 DIAGNOSIS — R0902 Hypoxemia: Secondary | ICD-10-CM | POA: Diagnosis not present

## 2022-03-20 DIAGNOSIS — R1319 Other dysphagia: Secondary | ICD-10-CM | POA: Diagnosis not present

## 2022-03-20 DIAGNOSIS — R053 Chronic cough: Secondary | ICD-10-CM | POA: Diagnosis not present

## 2022-03-20 DIAGNOSIS — Z23 Encounter for immunization: Secondary | ICD-10-CM | POA: Diagnosis not present

## 2022-04-04 ENCOUNTER — Ambulatory Visit
Admission: RE | Admit: 2022-04-04 | Discharge: 2022-04-04 | Disposition: A | Discharge status: Home / Self Care | Payer: PPO | Source: Ambulatory Visit | Attending: Internal Medicine | Admitting: Internal Medicine

## 2022-04-04 DIAGNOSIS — J189 Pneumonia, unspecified organism: Secondary | ICD-10-CM | POA: Diagnosis not present

## 2022-04-04 DIAGNOSIS — R918 Other nonspecific abnormal finding of lung field: Secondary | ICD-10-CM | POA: Diagnosis not present

## 2022-04-04 DIAGNOSIS — R0602 Shortness of breath: Secondary | ICD-10-CM

## 2022-04-24 ENCOUNTER — Ambulatory Visit (INDEPENDENT_AMBULATORY_CARE_PROVIDER_SITE_OTHER): Payer: PPO | Admitting: Pulmonary Disease

## 2022-04-24 ENCOUNTER — Encounter: Payer: Self-pay | Admitting: Pulmonary Disease

## 2022-04-24 VITALS — BP 126/70 | HR 85 | Temp 98.6°F | Ht 71.0 in | Wt 193.0 lb

## 2022-04-24 DIAGNOSIS — J849 Interstitial pulmonary disease, unspecified: Secondary | ICD-10-CM | POA: Diagnosis not present

## 2022-04-24 NOTE — Patient Instructions (Signed)
Will get some labs today for better evaluation of the lungs Schedule high-res CT and PFTs Return to clinic after PFTs in 2 to 3 months.

## 2022-04-24 NOTE — Progress Notes (Signed)
Joseph Crosby    [id number]    March 04, 1941  Primary Care Physician:Henderson, Walker Shadow, MD  Referring Physician: Kathalene Frames, MD [address], [address] [place],  [place] [id number]  Chief complaint: Consult for interstitial lung disease  HPI: 82 y.o. who  has a past medical history of Dysphasia, Dyspnea, GERD (gastroesophageal reflux disease), History of hiatal hernia, Hypertension, Pneumonia, and Stroke ([place]).   Has been referred here for evaluation of dyspnea on exertion.  Symptoms started about 6 months ago.  He reports having pneumonia in May 2023 which was treated with antibiotics as an outpatient.  Since then he has dyspnea on exertion when he walks at a fast pace.  Denies any cough, sputum pressure, fevers, chills.  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  No feather pillows or comforters ILD questionnaire 04/24/2022-negative for any exposures. Smoking history: Never smoker Travel history: Previously lived in [place], [place].  No significant recent travel Relevant family history: No significant family history of lung disease  Outpatient Encounter Medications as of 04/24/2022  Medication Sig   amLODipine (NORVASC) 5 MG tablet Take 5 mg by mouth daily.   aspirin EC 81 MG tablet Take 81 mg by mouth daily. Swallow whole.   atorvastatin (LIPITOR) 40 MG tablet Take 40 mg by mouth daily. In evening time   hydrochlorothiazide (MICROZIDE) 12.5 MG capsule Take 12.5 mg by mouth daily.   losartan (COZAAR) 50 MG tablet Take 50 mg by mouth daily.   Multiple Vitamin (MULTIVITAMIN) tablet Take 1 tablet by mouth daily.   ondansetron (ZOFRAN-ODT) 8 MG disintegrating tablet Take 1 tablet (8 mg total) by mouth every 8 (eight) hours as  needed for nausea or vomiting.   oxyCODONE (OXY IR/ROXICODONE) 5 MG immediate release tablet Take 1 tablet (5 mg total) by mouth every 6 (six) hours as needed for breakthrough pain or severe pain.   oxymetazoline (AFRIN) 0.05 % nasal spray Place 1 spray into both nostrils 2 (two) times daily.   pantoprazole (PROTONIX) 40 MG tablet Take 40 mg by mouth daily.   [DISCONTINUED] acetaminophen (TYLENOL) 325 MG tablet Take 2 tablets (650 mg total) by mouth every 6 (six) hours as needed for mild pain or moderate pain.   [DISCONTINUED] celecoxib (CELEBREX) 200 MG capsule Take 1 capsule (200 mg total) by mouth 2 (two) times daily.   No facility-administered encounter medications on file as of 04/24/2022.    Allergies as of 04/24/2022   (No Known Allergies)    Past Medical History:  Diagnosis Date   Dysphasia    barrium on 11/02/2021  able to swallow pills and eat with no problems   Dyspnea    when he had pneumonia in june 2023   GERD (gastroesophageal reflux disease)    History of hiatal hernia    Hypertension    Pneumonia    had pneumonia in june 2023   Stroke [facility])    called them TIA hasn't had any in 10 yrears. 11/11/2021    Past Surgical History:  Procedure Laterality Date   COLONOSCOPY N/A    has had a few. 11/11/2021   NESBIT PROCEDURE N/A 11/22/2021   Procedure: NESBIT PROCEDURE/PENILE PLICATION/POSSIBLE INCISION AND GRAFTING;  Surgeon: Vira Agar, MD;  Location: [facility];  Service: Urology;  Laterality: N/A;   PENILE PROSTHESIS IMPLANT N/A 11/22/2021   Procedure: IMPLANTATION OF INFLATABLE PENILE PROSTHESIS;  Surgeon: Vira Agar, MD;  Location: [facility];  Service: Urology;  Laterality: N/A;  [place]     had done 20+ yrs ago. 11/11/2021    No family history on file.  Social History   Socioeconomic History   Marital status: Widowed    Spouse name: Not on file   Number of children: Not on file   Years of  education: Not on file   Highest education level: Not on file  Occupational History   Not on file  Tobacco Use   Smoking status: Never   Smokeless tobacco: Never  Vaping Use   Vaping Use: Never used  Substance and Sexual Activity   Alcohol use: Not Currently   Drug use: Never   Sexual activity: Not on file  Other Topics Concern   Not on file  Social History Narrative   Not on file   Social Determinants of Health   Financial Resource Strain: Not on file  Food Insecurity: Not on file  Transportation Needs: Not on file  Physical Activity: Not on file  Stress: Not on file  Social Connections: Not on file  Intimate Partner Violence: Not on file    Review of systems: Review of Systems  Constitutional: Negative for fever and chills.  HENT: Negative.   Eyes: Negative for blurred vision.  Respiratory: as per HPI  Cardiovascular: Negative for chest pain and palpitations.  Gastrointestinal: Negative for vomiting, diarrhea, blood per rectum. Genitourinary: Negative for dysuria, urgency, frequency and hematuria.  Musculoskeletal: Negative for myalgias, back pain and joint pain.  Skin: Negative for itching and rash.  Neurological: Negative for dizziness, tremors, focal weakness, seizures and loss of consciousness.  Endo/Heme/Allergies: Negative for environmental allergies.  Psychiatric/Behavioral: Negative for depression, suicidal ideas and hallucinations.  All other systems reviewed and are negative.  Physical Exam: Blood pressure 126/70, pulse 85, temperature 98.6 F (37 C), temperature source Oral, height 5\' 11"  (1.803 m), weight 193 lb (87.5 kg), SpO2 95 %. Gen:      No acute distress HEENT:  EOMI, sclera anicteric Neck:     No masses; no thyromegaly Lungs:    Clear to auscultation bilaterally; normal respiratory effort CV:         Regular rate and rhythm; no murmurs Abd:      + bowel sounds; soft, non-tender; no palpable masses, no distension Ext:    No edema; adequate  peripheral perfusion Skin:      Warm and dry; no rash Neuro: alert and oriented x 3 Psych: normal mood and affect  Data Reviewed: Imaging: CT scan 04/04/2022-hazy groundglass opacities and linear bandlike opacity bases bilaterally.  Suggestive of postinflammatory scarring.  I have reviewed the images personally.  PFTs:  Labs:  Assessment:  Evaluation for dyspnea, concern for interstitial lung disease Has nonspecific dyspnea on exertion.  His CT scan shows some linear opacities which is suggestive of postinflammatory scarring.  I do not think there is significant interstitial lung disease there.  Will further evaluate by high-resolution CT, get basic labs including ANA, rheumatoid factor, CCP Schedule pulmonary function test for better evaluation of the lungs.  Plan/Recommendations: Basic CTD serologies High-res CT, PFTs.  Marshell Garfinkel MD [facility] 04/24/2022, 2:08 PM  CC: Kathalene Frames, *

## 2022-04-26 LAB — ANA,IFA RA DIAG PNL W/RFLX TIT/PATN
Anti Nuclear Antibody (ANA): NEGATIVE
Cyclic Citrullin Peptide Ab: <16 UNITS
Rheumatoid fact SerPl-aCnc: 20 IU/mL — ABNORMAL HIGH (ref <14)

## 2022-05-19 ENCOUNTER — Ambulatory Visit
Admission: RE | Admit: 2022-05-19 | Discharge: 2022-05-19 | Disposition: A | Discharge status: Home / Self Care | Payer: PPO | Source: Ambulatory Visit | Attending: Pulmonary Disease | Admitting: Pulmonary Disease

## 2022-05-19 DIAGNOSIS — J849 Interstitial pulmonary disease, unspecified: Secondary | ICD-10-CM

## 2022-05-19 DIAGNOSIS — I700 Atherosclerosis of aorta: Secondary | ICD-10-CM | POA: Diagnosis not present

## 2022-05-19 DIAGNOSIS — I2510 Atherosclerotic heart disease of native coronary artery without angina pectoris: Secondary | ICD-10-CM | POA: Diagnosis not present

## 2022-05-19 DIAGNOSIS — J984 Other disorders of lung: Secondary | ICD-10-CM | POA: Diagnosis not present

## 2022-05-30 ENCOUNTER — Encounter: Payer: Self-pay | Admitting: Pulmonary Disease

## 2022-06-06 NOTE — Telephone Encounter (Signed)
His CT looks stable from before with no significant interstitial lung disease.  There is some areas of scarring that could be from prior pneumonia.  Overall the scan looks okay  His labs are also normal except for borderline elevation of factor called ANA which is likely nonspecific.  He has PFTs ordered.  Can you please schedule PFTs and follow-up visit.

## 2022-10-30 DIAGNOSIS — I700 Atherosclerosis of aorta: Secondary | ICD-10-CM | POA: Diagnosis not present

## 2022-10-30 DIAGNOSIS — L989 Disorder of the skin and subcutaneous tissue, unspecified: Secondary | ICD-10-CM | POA: Diagnosis not present

## 2022-10-30 DIAGNOSIS — N486 Induration penis plastica: Secondary | ICD-10-CM | POA: Diagnosis not present

## 2022-10-30 DIAGNOSIS — I10 Essential (primary) hypertension: Secondary | ICD-10-CM | POA: Diagnosis not present

## 2022-10-30 DIAGNOSIS — Z0000 Encounter for general adult medical examination without abnormal findings: Secondary | ICD-10-CM | POA: Diagnosis not present

## 2022-10-30 DIAGNOSIS — I358 Other nonrheumatic aortic valve disorders: Secondary | ICD-10-CM | POA: Diagnosis not present

## 2022-10-30 DIAGNOSIS — R1319 Other dysphagia: Secondary | ICD-10-CM | POA: Diagnosis not present

## 2022-10-30 DIAGNOSIS — E785 Hyperlipidemia, unspecified: Secondary | ICD-10-CM | POA: Diagnosis not present

## 2022-10-30 DIAGNOSIS — H9190 Unspecified hearing loss, unspecified ear: Secondary | ICD-10-CM | POA: Diagnosis not present

## 2022-10-30 DIAGNOSIS — Z79899 Other long term (current) drug therapy: Secondary | ICD-10-CM | POA: Diagnosis not present

## 2022-10-30 DIAGNOSIS — Z8673 Personal history of transient ischemic attack (TIA), and cerebral infarction without residual deficits: Secondary | ICD-10-CM | POA: Diagnosis not present

## 2022-11-10 DIAGNOSIS — I358 Other nonrheumatic aortic valve disorders: Secondary | ICD-10-CM | POA: Diagnosis not present

## 2022-11-27 DIAGNOSIS — H903 Sensorineural hearing loss, bilateral: Secondary | ICD-10-CM | POA: Diagnosis not present

## 2022-11-27 DIAGNOSIS — H9313 Tinnitus, bilateral: Secondary | ICD-10-CM | POA: Diagnosis not present

## 2022-11-27 DIAGNOSIS — Z77122 Contact with and (suspected) exposure to noise: Secondary | ICD-10-CM | POA: Diagnosis not present

## 2022-12-21 DIAGNOSIS — L929 Granulomatous disorder of the skin and subcutaneous tissue, unspecified: Secondary | ICD-10-CM | POA: Diagnosis not present

## 2022-12-21 DIAGNOSIS — D2361 Other benign neoplasm of skin of right upper limb, including shoulder: Secondary | ICD-10-CM | POA: Diagnosis not present

## 2022-12-21 DIAGNOSIS — X32XXXA Exposure to sunlight, initial encounter: Secondary | ICD-10-CM | POA: Diagnosis not present

## 2022-12-21 DIAGNOSIS — C44319 Basal cell carcinoma of skin of other parts of face: Secondary | ICD-10-CM | POA: Diagnosis not present

## 2022-12-21 DIAGNOSIS — C44311 Basal cell carcinoma of skin of nose: Secondary | ICD-10-CM | POA: Diagnosis not present

## 2022-12-21 DIAGNOSIS — B078 Other viral warts: Secondary | ICD-10-CM | POA: Diagnosis not present

## 2022-12-21 DIAGNOSIS — L570 Actinic keratosis: Secondary | ICD-10-CM | POA: Diagnosis not present

## 2023-01-18 DIAGNOSIS — D3612 Benign neoplasm of peripheral nerves and autonomic nervous system, upper limb, including shoulder: Secondary | ICD-10-CM | POA: Diagnosis not present

## 2023-01-18 DIAGNOSIS — D485 Neoplasm of uncertain behavior of skin: Secondary | ICD-10-CM | POA: Diagnosis not present

## 2023-01-18 DIAGNOSIS — Z85828 Personal history of other malignant neoplasm of skin: Secondary | ICD-10-CM | POA: Diagnosis not present

## 2023-01-18 DIAGNOSIS — Z08 Encounter for follow-up examination after completed treatment for malignant neoplasm: Secondary | ICD-10-CM | POA: Diagnosis not present

## 2023-03-13 DIAGNOSIS — H524 Presbyopia: Secondary | ICD-10-CM | POA: Diagnosis not present

## 2023-03-13 DIAGNOSIS — H04213 Epiphora due to excess lacrimation, bilateral lacrimal glands: Secondary | ICD-10-CM | POA: Diagnosis not present

## 2023-03-13 DIAGNOSIS — H2513 Age-related nuclear cataract, bilateral: Secondary | ICD-10-CM | POA: Diagnosis not present

## 2023-03-29 IMAGING — RF DG ESOPHAGUS
8 of 9 series · 14 of 24 positions shown · non-contrast
Comparison: None Available.

CLINICAL DATA: Esophageal dysphagia

EXAM:
ESOPHOGRAM / BARIUM SWALLOW / BARIUM TABLET STUDY
TECHNIQUE: Combined double contrast and single contrast examination performed
using effervescent crystals, thick barium liquid, and thin barium
liquid. The patient was observed with fluoroscopy swallowing a 13 mm
barium sulphate tablet.
FLUOROSCOPY:
Radiation Exposure Index (as provided by the fluoroscopic device):
15.56 mGy Kerma

[Series 1: sequence · 2 of 22 frames shown (1 of 5)]
[frame 4/22]
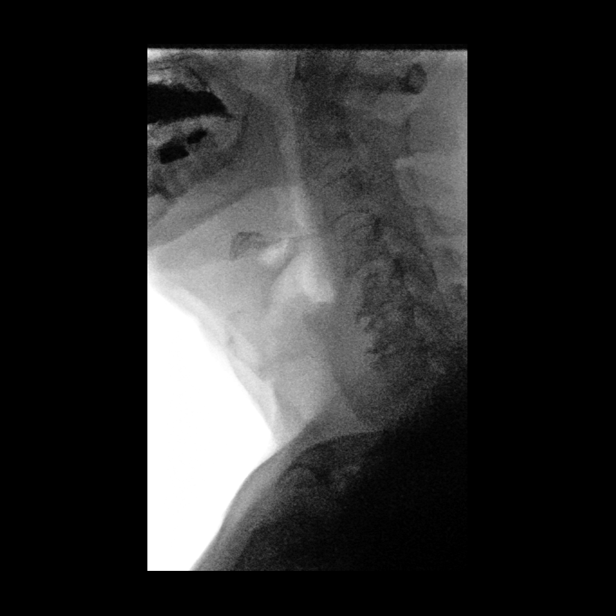
[frame 19/22]
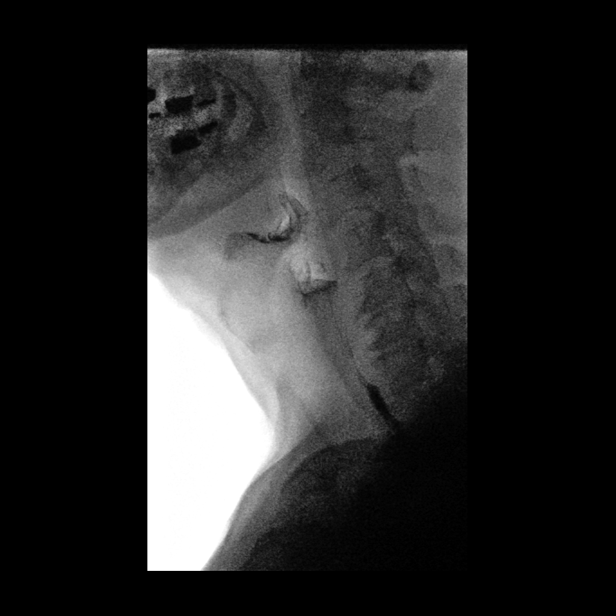

[Series 3: sequence · 2 of 17 frames shown (2 of 5)]
[frame 1/17]
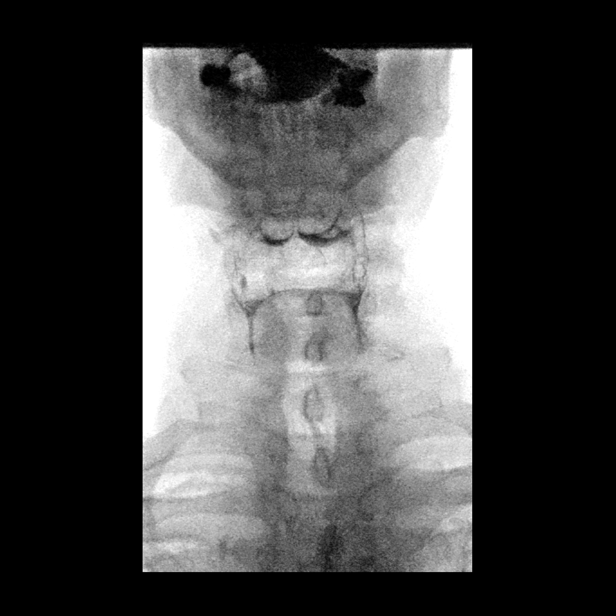
[frame 15/17]
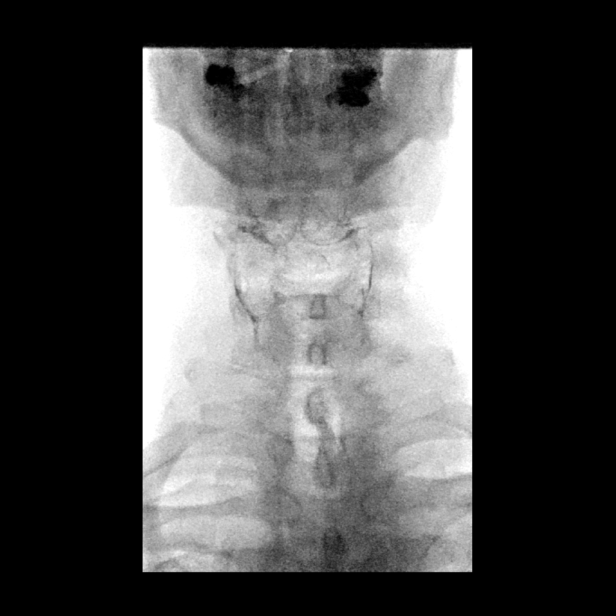

[Series 5: sequence · 1 of 10 frames shown (3 of 5)]
[frame 2/10]
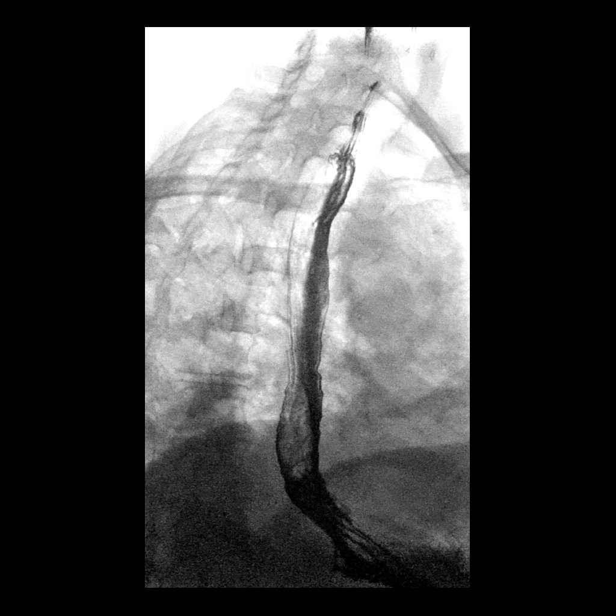

[Series 6: one shot · 0.14mm/px · 1 of 3 slices shown (1 of 3)]
[im 1/3]
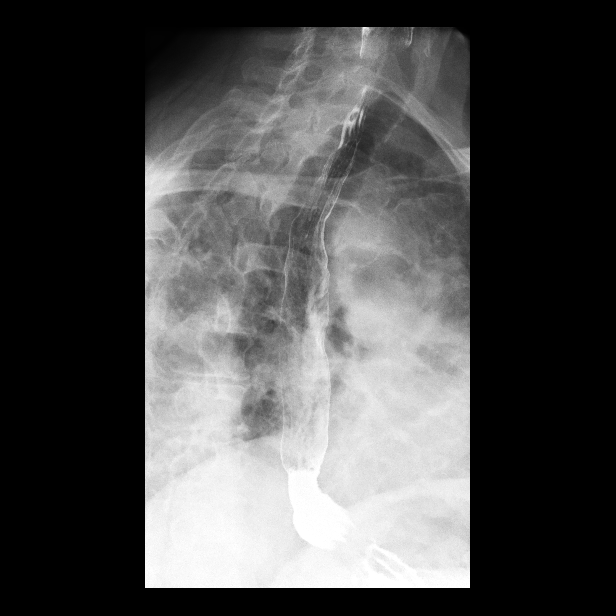

[Series 7: sequence · 2 of 56 frames shown (4 of 5)]
[frame 29/56]
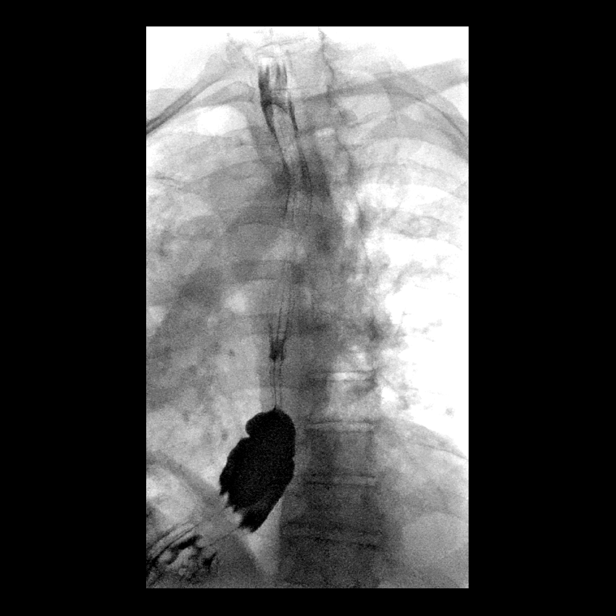
[frame 48/56]
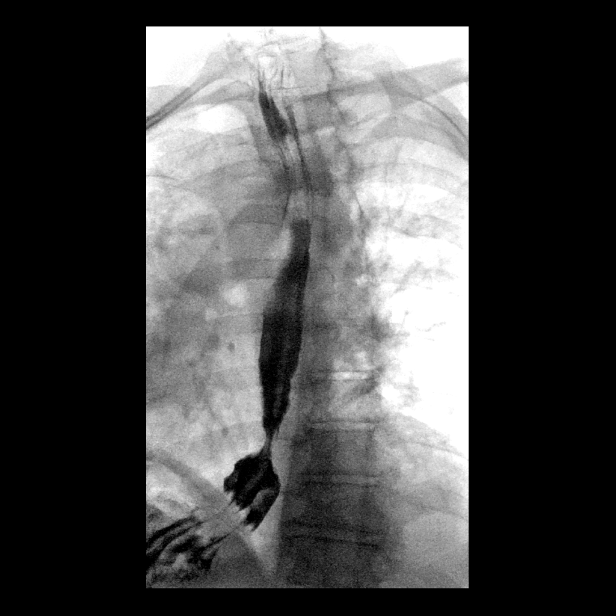

[Series 8: one shot · 2 of 11 slices shown (2 of 3)]
[im 4/11]
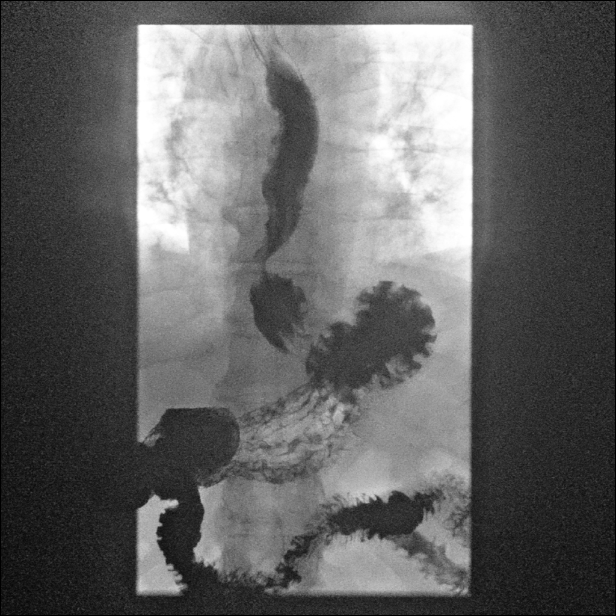
[im 7/11]
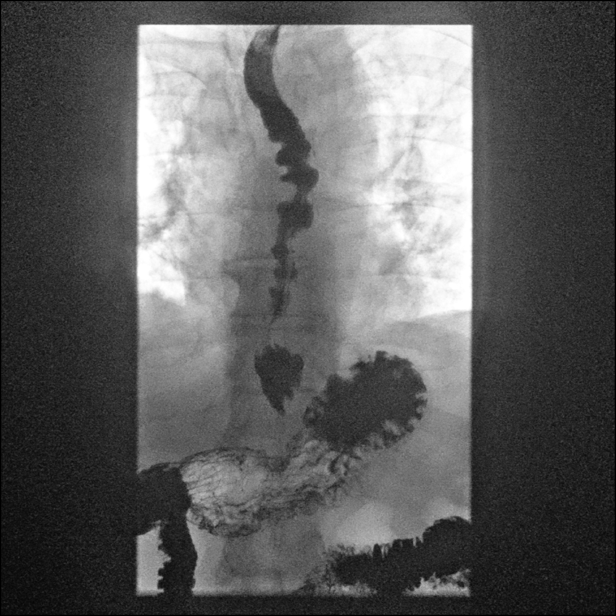

[Series 9: sequence · 2 of 34 frames shown (5 of 5)]
[frame 6/34]
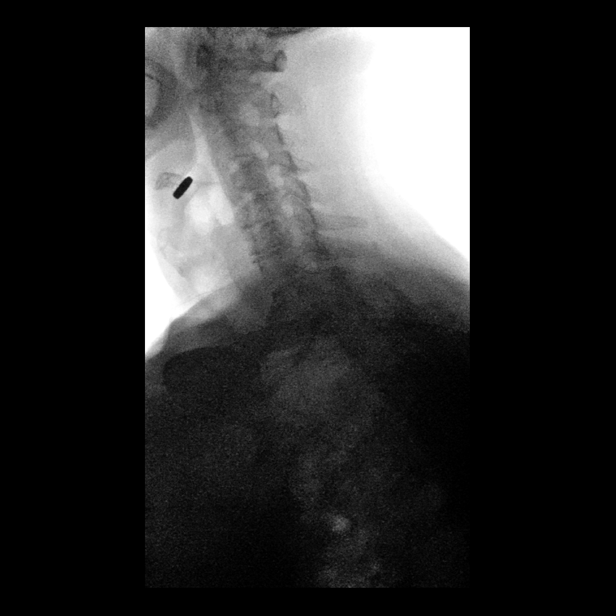
[frame 18/34]
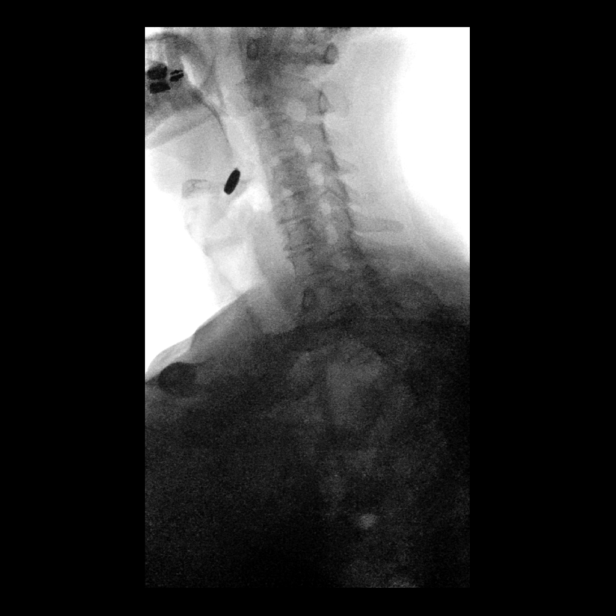

[Series 10: one shot · 2 of 6 slices shown (3 of 3)]
[im 2/6]
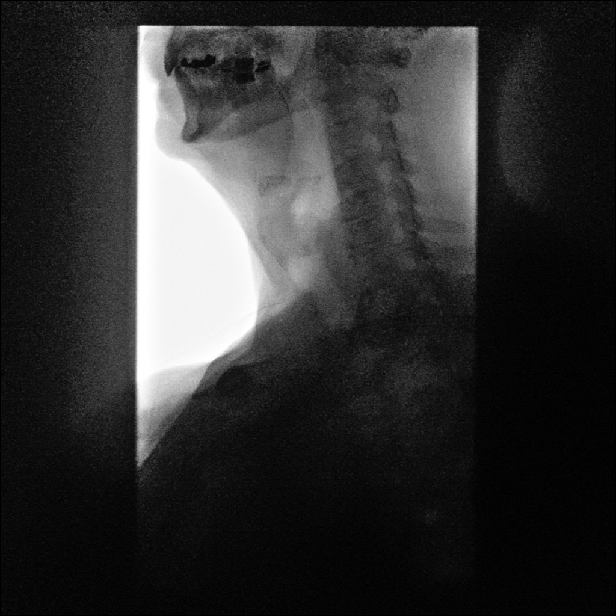
[im 6/6]
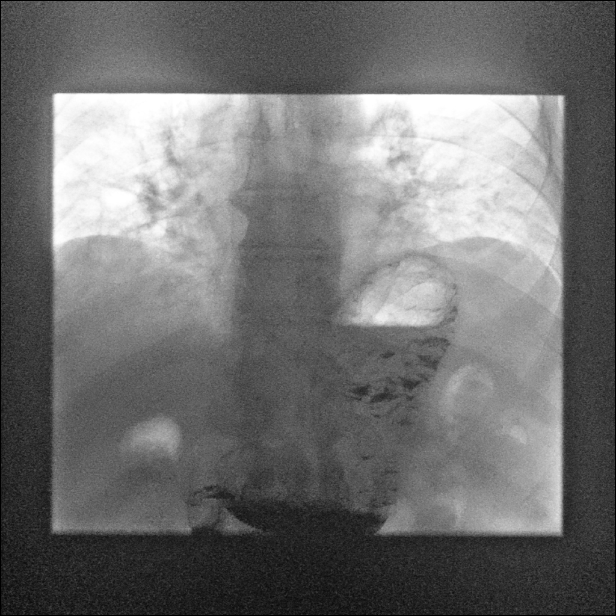

[14 of 24 positions shown; findings below may reference images not displayed]

FINDINGS: Cervical esophagram: No evidence of stricture or diverticulum. No
laryngeal penetration or intratracheal aspiration.

Thoracic esophagram: No obvious mucosal abnormality. No visible
ulceration. Moderate size hiatal hernia. Spontaneous
gastroesophageal reflux occurred to the lower esophagus during the
exam. Moderate esophageal dysmotility. A 13 mm barium tablet lodged
in the vallecula, eventually cleared the vallecula and passed down
into the thoracic esophagus after repeated swallows of water. There
was a transient delay in passage of the tablet in the distal
esophagus near the gastroesophageal junction, which passed within 3
minutes.
IMPRESSION: Moderate esophageal dysmotility. A 13 mm barium tablet lodged in the
vallecula, eventually cleared and passed down into the thoracic
esophagus after repeated swallows of water. This may account for the
patient's symptoms. No evidence of cervical esophageal stricture.

The barium tablet transiently lodged in the distal esophagus near
the GE junction but passed within 3 minutes. No definite thoracic
esophageal stricture.

Moderate-sized hiatal hernia. Mild gastroesophageal reflux occurred
to the lower esophagus during the exam.

## 2023-05-02 ENCOUNTER — Ambulatory Visit: Payer: PPO | Admitting: Pulmonary Disease

## 2023-06-04 ENCOUNTER — Other Ambulatory Visit (HOSPITAL_BASED_OUTPATIENT_CLINIC_OR_DEPARTMENT_OTHER): Payer: Self-pay

## 2023-06-04 DIAGNOSIS — J849 Interstitial pulmonary disease, unspecified: Secondary | ICD-10-CM

## 2023-06-06 ENCOUNTER — Ambulatory Visit: Payer: PPO | Admitting: Pulmonary Disease

## 2023-06-06 ENCOUNTER — Encounter: Payer: Self-pay | Admitting: Pulmonary Disease

## 2023-06-06 ENCOUNTER — Ambulatory Visit (HOSPITAL_BASED_OUTPATIENT_CLINIC_OR_DEPARTMENT_OTHER): Payer: PPO | Admitting: Pulmonary Disease

## 2023-06-06 VITALS — BP 108/52 | HR 107 | Ht 68.5 in | Wt 198.0 lb

## 2023-06-06 DIAGNOSIS — R918 Other nonspecific abnormal finding of lung field: Secondary | ICD-10-CM

## 2023-06-06 DIAGNOSIS — Z8673 Personal history of transient ischemic attack (TIA), and cerebral infarction without residual deficits: Secondary | ICD-10-CM | POA: Insufficient documentation

## 2023-06-06 DIAGNOSIS — R1314 Dysphagia, pharyngoesophageal phase: Secondary | ICD-10-CM | POA: Insufficient documentation

## 2023-06-06 DIAGNOSIS — J849 Interstitial pulmonary disease, unspecified: Secondary | ICD-10-CM

## 2023-06-06 DIAGNOSIS — K219 Gastro-esophageal reflux disease without esophagitis: Secondary | ICD-10-CM | POA: Insufficient documentation

## 2023-06-06 DIAGNOSIS — I700 Atherosclerosis of aorta: Secondary | ICD-10-CM | POA: Insufficient documentation

## 2023-06-06 DIAGNOSIS — I10 Essential (primary) hypertension: Secondary | ICD-10-CM | POA: Insufficient documentation

## 2023-06-06 DIAGNOSIS — Z860100 Personal history of colon polyps, unspecified: Secondary | ICD-10-CM | POA: Insufficient documentation

## 2023-06-06 DIAGNOSIS — K5790 Diverticulosis of intestine, part unspecified, without perforation or abscess without bleeding: Secondary | ICD-10-CM | POA: Insufficient documentation

## 2023-06-06 DIAGNOSIS — E785 Hyperlipidemia, unspecified: Secondary | ICD-10-CM | POA: Insufficient documentation

## 2023-06-06 DIAGNOSIS — N486 Induration penis plastica: Secondary | ICD-10-CM | POA: Insufficient documentation

## 2023-06-06 LAB — PULMONARY FUNCTION TEST
DL/VA % pred: 120 %
DL/VA: 4.72 ml/min/mmHg/L
DLCO cor % pred: 102 %
DLCO cor: 23.84 ml/min/mmHg
DLCO unc % pred: 102 %
DLCO unc: 23.84 ml/min/mmHg
FEF 25-75 Post: 4.62 L/s
FEF 25-75 Pre: 3.86 L/s
FEF2575-%Change-Post: 19 %
FEF2575-%Pred-Post: 251 %
FEF2575-%Pred-Pre: 210 %
FEV1-%Change-Post: 5 %
FEV1-%Pred-Post: 114 %
FEV1-%Pred-Pre: 108 %
FEV1-Post: 3.06 L
FEV1-Pre: 2.9 L
FEV1FVC-%Change-Post: -1 %
FEV1FVC-%Pred-Pre: 121 %
FEV6-%Change-Post: 7 %
FEV6-%Pred-Post: 101 %
FEV6-%Pred-Pre: 94 %
FEV6-Post: 3.55 L
FEV6-Pre: 3.32 L
FEV6FVC-%Pred-Post: 107 %
FEV6FVC-%Pred-Pre: 107 %
FVC-%Change-Post: 7 %
FVC-%Pred-Post: 94 %
FVC-%Pred-Pre: 87 %
FVC-Post: 3.55 L
FVC-Pre: 3.32 L
Post FEV1/FVC ratio: 86 %
Post FEV6/FVC ratio: 100 %
Pre FEV1/FVC ratio: 87 %
Pre FEV6/FVC Ratio: 100 %
RV % pred: 0 %
RV: 0.03 L
TLC % pred: 70 %
TLC: 4.74 L

## 2023-06-06 NOTE — Progress Notes (Signed)
 Joseph Crosby    [id number]    1940/06/09  Primary Care Physician:Henderson, Sherie Don, MD  Referring Physician: Emilio Aspen, MD [address] [place],  [place] [address]  Chief complaint: Follow up for interstitial lung disease  HPI: 83 y.o. who  has a past medical history of Dysphasia, Dyspnea, GERD (gastroesophageal reflux disease), History of hiatal hernia, Hypertension, Pneumonia, and Stroke ([facility]).   Has been referred here for evaluation of dyspnea on exertion.  Symptoms started about 6 months ago.  He reports having pneumonia in May 2023 which was treated with antibiotics as an outpatient.  Since then he has dyspnea on exertion when he walks at a fast pace.  Denies any cough, sputum pressure, fevers, chills.  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  No feather pillows or comforters ILD questionnaire 04/24/2022-negative for any exposures. Smoking history: Never smoker Travel history: Previously lived in [place], [place].  No significant recent travel Relevant family history: No significant family history of lung disease  Interim history: Discussed the use of AI scribe software for clinical note transcription with the patient, who gave verbal consent to proceed.  Joseph Crosby is an 83 year old male who presents for follow-up of lung function and CT scan results.  Breathing is described as 'pretty good' with no significant issues since the last visit. Initially, there were breathing difficulties due to pneumonia, which have resolved, but left some scarring in the lungs. Lung function tests indicate a slight reduction in lung capacity, attributed to the scarring from pneumonia. A CT scan was performed to evaluate for  interstitial lung disease, showing some scarring. Blood tests were conducted, and the results are consistent with the scarring being residual from past pneumonia.  There is a borderline elevation in rheumatoid factor, but no frequent joint pain or stiffness. Occasionally, there is stiffness in the thumb and forefinger, occurring once every two to three months, resolving with movement and exercise.  He feels a reduction in lung capacity when overexerting but otherwise feels healthy. He is trying to stay active and maintain his health, though controlling his diet is challenging as he often eats out. He lost 15 pounds during the pneumonia episode and has attempted to keep the weight off.   Outpatient Encounter Medications as of 06/06/2023  Medication Sig   amLODipine (NORVASC) 5 MG tablet Take 5 mg by mouth daily.   aspirin EC 81 MG tablet Take 81 mg by mouth daily. Swallow whole.   atorvastatin (LIPITOR) 40 MG tablet Take 40 mg by mouth daily. In evening time   hydrochlorothiazide (MICROZIDE) 12.5 MG capsule Take 12.5 mg by mouth daily.   losartan (COZAAR) 50 MG tablet Take 50 mg by mouth daily.   Multiple Vitamin (MULTIVITAMIN) tablet Take 1 tablet by mouth daily.   oxymetazoline (AFRIN) 0.05 % nasal spray Place 1 spray into both nostrils 2 (two) times daily.   pantoprazole (PROTONIX) 40 MG tablet Take 40 mg by mouth daily.   [DISCONTINUED] ondansetron (ZOFRAN-ODT) 8 MG disintegrating tablet Take 1 tablet (8 mg total) by mouth every 8 (eight) hours as needed for nausea or vomiting.   [DISCONTINUED] oxyCODONE (OXY IR/ROXICODONE) 5 MG immediate release tablet  Take 1 tablet (5 mg total) by mouth every 6 (six) hours as needed for breakthrough pain or severe pain.   No facility-administered encounter medications on file as of 06/06/2023.    Physical Exam: Blood pressure (!) 108/52, pulse (!) 107, height 5' 8.5" (1.74 m), weight 198 lb (89.8 kg), SpO2 92%. Gen:      No acute distress HEENT:  EOMI,  sclera anicteric Neck:     No masses; no thyromegaly Lungs:    Clear to auscultation bilaterally; normal respiratory effort CV:         Regular rate and rhythm; no murmurs Abd:      + bowel sounds; soft, non-tender; no palpable masses, no distension Ext:    No edema; adequate peripheral perfusion Skin:      Warm and dry; no rash Neuro: alert and oriented x 3 Psych: normal mood and affect   Data Reviewed: Imaging: CT scan 04/04/2022- hazy groundglass opacities and linear bandlike opacity bases bilaterally.  Suggestive of postinflammatory scarring.   High resolution CT 05/19/2022-multiple linear areas of architectural distortion, volume loss, stable patchy areas of groundglass attenuation which is improved compared to before.  Alternate diagnosis I have reviewed the images personally.  PFTs: 06/06/2023 FVC 2.55 [94%], FEV1 3.06 [114%], F/F86, TLC 6.76 [70%], DLCO 23.84 [102%] Minimal restriction  Labs: CTD serologies 04/24/2022 CCP less than 16, rheumatoid factor 20  Assessment:  Evaluation for dyspnea, concern for interstitial lung disease CT scan reveals what looks like postinflammatory pulmonary scarring, likely residual from past pneumonia, appearing benign. Lung function tests show minimal reduction in lung capacity, possibly due to scarring. He is asymptomatic with no shortness of breath or significant respiratory distress. A conservative approach with monitoring is appropriate.  There is no clear evidence that there is significant underlying interstitial lung disease - Schedule follow-up CT scan in one year to monitor lung scarring  Borderline elevated rheumatoid factor Lab results indicate borderline elevated rheumatoid factor, common in older adults without rheumatoid arthritis. He experiences occasional thumb and forefinger stiffness, resolving with movement, inconsistent with active rheumatoid arthritis. Monitoring for symptom changes is advised. - Monitor for increased frequency  or severity of joint symptoms  Weight management Advised to lose weight for overall health improvement. Weight loss occurred during recent pneumonia but maintenance is challenging due to frequent dining out. Discussed importance of diet and exercise in weight management, emphasizing active lifestyle and healthy diet. - Encourage active lifestyle and healthy diet - Advise weight loss of 6-8 pounds as recommended by primary care physician   Plan/Recommendations: Follow-up CT in 1 year  Chilton Greathouse MD [facility] 06/06/2023, 3:17 PM  CC: Emilio Aspen, *

## 2023-06-06 NOTE — Patient Instructions (Signed)
 Full pft performed today.

## 2023-06-06 NOTE — Patient Instructions (Signed)
 VISIT SUMMARY:  Joseph Crosby, during your follow-up visit, we reviewed your lung function and CT scan results. Your breathing has been stable, and the scarring in your lungs from past pneumonia is not causing significant issues. We also discussed your borderline elevated rheumatoid factor and occasional joint stiffness, as well as your efforts to manage your weight.  YOUR PLAN:  -PULMONARY SCARRING: Pulmonary scarring refers to the formation of scar tissue in the lungs, often as a result of past infections like pneumonia. Your CT scan shows some scarring, but it appears benign and is not causing significant breathing issues. We will monitor this with a follow-up CT scan in one year.  -BORDERLINE ELEVATED RHEUMATOID FACTOR: A borderline elevated rheumatoid factor can be seen in older adults and does not necessarily indicate rheumatoid arthritis. You have occasional stiffness in your thumb and forefinger, which resolves with movement. We will keep an eye on this and monitor for any changes in your symptoms.  -WEIGHT MANAGEMENT: Maintaining a healthy weight is important for your overall health. You lost some weight during your pneumonia episode, but keeping it off has been challenging. We discussed the importance of a healthy diet and regular exercise. Aim to lose an additional 6-8 pounds as recommended by your primary care physician.  INSTRUCTIONS:  Please schedule a follow-up CT scan in one year to monitor your lung scarring. Additionally, keep track of any changes in your joint symptoms and continue working on your weight management goals.  For more information, you can read your full clinical note, available in your patient portal.

## 2023-06-06 NOTE — Progress Notes (Signed)
 Full pft performed today.

## 2023-06-18 DIAGNOSIS — H04123 Dry eye syndrome of bilateral lacrimal glands: Secondary | ICD-10-CM | POA: Diagnosis not present

## 2023-10-02 DIAGNOSIS — H04123 Dry eye syndrome of bilateral lacrimal glands: Secondary | ICD-10-CM | POA: Diagnosis not present

## 2023-10-02 DIAGNOSIS — H2513 Age-related nuclear cataract, bilateral: Secondary | ICD-10-CM | POA: Diagnosis not present

## 2023-11-01 DIAGNOSIS — Z79899 Other long term (current) drug therapy: Secondary | ICD-10-CM | POA: Diagnosis not present

## 2023-11-01 DIAGNOSIS — I10 Essential (primary) hypertension: Secondary | ICD-10-CM | POA: Diagnosis not present

## 2023-11-01 DIAGNOSIS — R1319 Other dysphagia: Secondary | ICD-10-CM | POA: Diagnosis not present

## 2023-11-01 DIAGNOSIS — N486 Induration penis plastica: Secondary | ICD-10-CM | POA: Diagnosis not present

## 2023-11-01 DIAGNOSIS — K909 Intestinal malabsorption, unspecified: Secondary | ICD-10-CM | POA: Diagnosis not present

## 2023-11-01 DIAGNOSIS — Z0000 Encounter for general adult medical examination without abnormal findings: Secondary | ICD-10-CM | POA: Diagnosis not present

## 2023-11-01 DIAGNOSIS — I2510 Atherosclerotic heart disease of native coronary artery without angina pectoris: Secondary | ICD-10-CM | POA: Diagnosis not present

## 2023-11-01 DIAGNOSIS — J849 Interstitial pulmonary disease, unspecified: Secondary | ICD-10-CM | POA: Diagnosis not present

## 2023-11-01 DIAGNOSIS — Z8673 Personal history of transient ischemic attack (TIA), and cerebral infarction without residual deficits: Secondary | ICD-10-CM | POA: Diagnosis not present

## 2023-11-01 DIAGNOSIS — K219 Gastro-esophageal reflux disease without esophagitis: Secondary | ICD-10-CM | POA: Diagnosis not present

## 2023-11-01 DIAGNOSIS — I351 Nonrheumatic aortic (valve) insufficiency: Secondary | ICD-10-CM | POA: Diagnosis not present

## 2023-11-01 DIAGNOSIS — R399 Unspecified symptoms and signs involving the genitourinary system: Secondary | ICD-10-CM | POA: Diagnosis not present

## 2023-11-01 DIAGNOSIS — E785 Hyperlipidemia, unspecified: Secondary | ICD-10-CM | POA: Diagnosis not present
